# Patient Record
Sex: Female | Born: 1975
Health system: Southern US, Community
[De-identification: ages and names within clinical notes are randomized; demographics above are authoritative.]

## PROBLEM LIST (undated history)

## (undated) DIAGNOSIS — G43909 Migraine, unspecified, not intractable, without status migrainosus: Secondary | ICD-10-CM

## (undated) DIAGNOSIS — I509 Heart failure, unspecified: Secondary | ICD-10-CM

## (undated) DIAGNOSIS — F419 Anxiety disorder, unspecified: Secondary | ICD-10-CM

## (undated) DIAGNOSIS — J42 Unspecified chronic bronchitis: Secondary | ICD-10-CM

## (undated) DIAGNOSIS — F319 Bipolar disorder, unspecified: Secondary | ICD-10-CM

## (undated) DIAGNOSIS — E7800 Pure hypercholesterolemia, unspecified: Secondary | ICD-10-CM

## (undated) DIAGNOSIS — F329 Major depressive disorder, single episode, unspecified: Secondary | ICD-10-CM

## (undated) DIAGNOSIS — R51 Headache: Secondary | ICD-10-CM

## (undated) DIAGNOSIS — F32A Depression, unspecified: Secondary | ICD-10-CM

## (undated) DIAGNOSIS — R519 Headache, unspecified: Secondary | ICD-10-CM

## (undated) DIAGNOSIS — G4730 Sleep apnea, unspecified: Secondary | ICD-10-CM

## (undated) DIAGNOSIS — I10 Essential (primary) hypertension: Secondary | ICD-10-CM

## (undated) DIAGNOSIS — E119 Type 2 diabetes mellitus without complications: Secondary | ICD-10-CM

## (undated) DIAGNOSIS — F209 Schizophrenia, unspecified: Secondary | ICD-10-CM

## (undated) HISTORY — PX: TOTAL KNEE ARTHROPLASTY: SHX125

## (undated) HISTORY — PX: PERCUTANEOUS PINNING PHALANX FRACTURE OF HAND: SUR1027

## (undated) HISTORY — PX: TONSILLECTOMY: SUR1361

## (undated) HISTORY — PX: JOINT REPLACEMENT: SHX530

## (undated) HISTORY — PX: LAPAROSCOPIC CHOLECYSTECTOMY: SUR755

---
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
## 2007-07-25 ENCOUNTER — Ambulatory Visit: Payer: Self-pay | Admitting: Psychiatry

## 2007-07-25 ENCOUNTER — Inpatient Hospital Stay (HOSPITAL_COMMUNITY): Admission: AD | Admit: 2007-07-25 | Discharge: 2007-07-26 | Payer: Self-pay | Admitting: Psychiatry

## 2010-07-23 NOTE — Discharge Summary (Signed)
NAME:  Diana Rivera, Diana Rivera NO.:  [id number]   MEDICAL RECORD NO.:  [id number]          PATIENT TYPE:  IPS   LOCATION:  0301                          FACILITY:  [facility]   PHYSICIAN:  Jasmine Pang, M.D. DATE OF BIRTH:  May 02, 1975   DATE OF ADMISSION:  07/25/2007  DATE OF DISCHARGE:  07/26/2007                               DISCHARGE SUMMARY   IDENTIFICATION:  This is a 35 year old single white female from  [place], [place], who was admitted on an involuntary basis on  Jul 25, 2007.   HISTORY OF PRESENT ILLNESS:  The patient is here on commitment papers.  The papers state that she was threatening an overdose on medications,  saying she wanted to die and was going to kill herself.  The patient  reports that she was very upset because her dog died yesterday.  She was  admitted by her aunt because she began to drink some and began to make  statements about wanting to die.  Her aunt felt she was dangerous and  took out IHM papers on her.  She denies a history of depression or mood  swings.  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   PAST PSYCHIATRIC HISTORY:  This is the first [facility] admission.  There was  no previous history of psychiatric problems.   ALCOHOL AND DRUG HISTORY:  As above, she denies drug use.  She drinks  wine daily according to the commitment papers.   MEDICAL HISTORY:  The patient has a history of GERD.  She has had a  recent esophageal study.   MEDICATIONS:  Protonix 40 mg daily.   ALLERGIES:  No known drug allergies.   PHYSICAL FINDINGS:  There were no acute physical or medical problems  noted.   ADMISSION LABORATORIES:  These were done in the ED prior to admission  and evaluated by the ED physician.   HOSPITAL COURSE:  Upon admission, the patient was started on Protonix 40  mg daily. She was also started on Librium detox protocol, although it  was felt this was not necessary given her reports of not  drinking as  much as the family stated.  She was friendly and cooperative.  In  individual sessions, she also participated appropriately in unit  therapeutic groups and activities.  She denied she was suicidal.  Her  general appearance was neat, eye contact good, motor behavior normal,  speech normal rate and flow.  Mood euthymic.  Affect appropriate.  Anxiety level none.  Thought process is coherent.  Thought content, no  predominant theme.  Cognitive was grossly back to baseline.  Perception  revealed no abnormalities.  Judgment was good and insight was fair.  It  was felt, the patient was safe for discharge today and was sent home.  After a case manager talked with her mother, her mother felt completely  safe with the patient being discharged and did not feel that she was a  threat to herself or others.   DISCHARGE DIAGNOSES:  Axis I:  Depressive disorder not otherwise  specified.  Polysubstance abuse ( there was cocaine and benzodiazepines  in her urine drug screen, though she felt she got benzodiazepines at the  hospital).  Axis II:  None.  Axis III:  Gastroesophageal reflux disease.  Axis IV:  Severe (recent death of dog, problems with primary support  group, other psychosocial problems, burden of psychiatric and substance  abuse illness).  Axis V:  Global assessment of functioning was 50 upon discharge.  GAF  was 45 upon admission.  GAF highest past year was 60-65.   DISCHARGE PLANS:  There was no specific activity level or dietary  restrictions.   POSTHOSPITAL CARE PLANS:  The patient will go to [facility] on Jul 27, 2007, at 9:00 a.m.   DISCHARGE MEDICATIONS:  None.   She is to follow up with her family doctor in 2 weeks for repeat labs.      Jasmine Pang, M.D.  Electronically Signed     BHS/MEDQ  D:  07/26/2007  T:  07/27/2007  Job:  [id number]

## 2010-12-04 LAB — URINALYSIS, ROUTINE W REFLEX MICROSCOPIC
Leukocytes, UA: NEGATIVE
Nitrite: NEGATIVE
Protein, ur: NEGATIVE
Urobilinogen, UA: 0.2

## 2010-12-04 LAB — URINE MICROSCOPIC-ADD ON

## 2010-12-04 LAB — CBC
Hemoglobin: 12.8
MCHC: 35.5
RDW: 13

## 2010-12-04 LAB — TSH: TSH: 2.897

## 2018-02-15 ENCOUNTER — Emergency Department (HOSPITAL_COMMUNITY): Payer: Self-pay

## 2018-02-15 ENCOUNTER — Inpatient Hospital Stay (HOSPITAL_COMMUNITY)
Admission: EM | Admit: 2018-02-15 | Discharge: 2018-03-05 | DRG: 086 | Disposition: A | Discharge status: Home / Self Care | Payer: Self-pay | Attending: General Surgery | Admitting: General Surgery

## 2018-02-15 ENCOUNTER — Encounter (HOSPITAL_COMMUNITY): Payer: Self-pay | Admitting: *Deleted

## 2018-02-15 ENCOUNTER — Other Ambulatory Visit: Payer: Self-pay

## 2018-02-15 DIAGNOSIS — S0280XA Fracture of other specified skull and facial bones, unspecified side, initial encounter for closed fracture: Secondary | ICD-10-CM

## 2018-02-15 DIAGNOSIS — S0285XA Fracture of orbit, unspecified, initial encounter for closed fracture: Principal | ICD-10-CM | POA: Diagnosis present

## 2018-02-15 DIAGNOSIS — R200 Anesthesia of skin: Secondary | ICD-10-CM | POA: Diagnosis present

## 2018-02-15 DIAGNOSIS — E876 Hypokalemia: Secondary | ICD-10-CM | POA: Diagnosis present

## 2018-02-15 DIAGNOSIS — S02402A Zygomatic fracture, unspecified, initial encounter for closed fracture: Secondary | ICD-10-CM

## 2018-02-15 DIAGNOSIS — S01112A Laceration without foreign body of left eyelid and periocular area, initial encounter: Secondary | ICD-10-CM | POA: Diagnosis present

## 2018-02-15 DIAGNOSIS — D696 Thrombocytopenia, unspecified: Secondary | ICD-10-CM | POA: Diagnosis present

## 2018-02-15 DIAGNOSIS — S80212A Abrasion, left knee, initial encounter: Secondary | ICD-10-CM | POA: Diagnosis present

## 2018-02-15 DIAGNOSIS — S0083XA Contusion of other part of head, initial encounter: Secondary | ICD-10-CM

## 2018-02-15 DIAGNOSIS — Z9049 Acquired absence of other specified parts of digestive tract: Secondary | ICD-10-CM

## 2018-02-15 DIAGNOSIS — S30810A Abrasion of lower back and pelvis, initial encounter: Secondary | ICD-10-CM | POA: Diagnosis present

## 2018-02-15 DIAGNOSIS — K920 Hematemesis: Secondary | ICD-10-CM | POA: Diagnosis present

## 2018-02-15 DIAGNOSIS — I509 Heart failure, unspecified: Secondary | ICD-10-CM | POA: Diagnosis present

## 2018-02-15 DIAGNOSIS — S02401A Maxillary fracture, unspecified, initial encounter for closed fracture: Secondary | ICD-10-CM

## 2018-02-15 DIAGNOSIS — K7460 Unspecified cirrhosis of liver: Secondary | ICD-10-CM | POA: Diagnosis present

## 2018-02-15 DIAGNOSIS — R748 Abnormal levels of other serum enzymes: Secondary | ICD-10-CM

## 2018-02-15 DIAGNOSIS — E119 Type 2 diabetes mellitus without complications: Secondary | ICD-10-CM | POA: Diagnosis present

## 2018-02-15 DIAGNOSIS — H1131 Conjunctival hemorrhage, right eye: Secondary | ICD-10-CM | POA: Diagnosis present

## 2018-02-15 DIAGNOSIS — S0990XA Unspecified injury of head, initial encounter: Secondary | ICD-10-CM

## 2018-02-15 DIAGNOSIS — S02609A Fracture of mandible, unspecified, initial encounter for closed fracture: Secondary | ICD-10-CM | POA: Diagnosis present

## 2018-02-15 DIAGNOSIS — Y908 Blood alcohol level of 240 mg/100 ml or more: Secondary | ICD-10-CM | POA: Diagnosis present

## 2018-02-15 DIAGNOSIS — I110 Hypertensive heart disease with heart failure: Secondary | ICD-10-CM | POA: Diagnosis present

## 2018-02-15 DIAGNOSIS — Z886 Allergy status to analgesic agent status: Secondary | ICD-10-CM

## 2018-02-15 DIAGNOSIS — F1010 Alcohol abuse, uncomplicated: Secondary | ICD-10-CM

## 2018-02-15 DIAGNOSIS — S022XXA Fracture of nasal bones, initial encounter for closed fracture: Secondary | ICD-10-CM | POA: Diagnosis present

## 2018-02-15 DIAGNOSIS — K7290 Hepatic failure, unspecified without coma: Secondary | ICD-10-CM | POA: Diagnosis not present

## 2018-02-15 DIAGNOSIS — Z590 Homelessness: Secondary | ICD-10-CM

## 2018-02-15 DIAGNOSIS — F10129 Alcohol abuse with intoxication, unspecified: Secondary | ICD-10-CM | POA: Diagnosis present

## 2018-02-15 DIAGNOSIS — S80211A Abrasion, right knee, initial encounter: Secondary | ICD-10-CM | POA: Diagnosis present

## 2018-02-15 DIAGNOSIS — S0230XA Fracture of orbital floor, unspecified side, initial encounter for closed fracture: Secondary | ICD-10-CM

## 2018-02-15 DIAGNOSIS — Z23 Encounter for immunization: Secondary | ICD-10-CM

## 2018-02-15 HISTORY — DX: Type 2 diabetes mellitus without complications: E11.9

## 2018-02-15 HISTORY — DX: Headache: R51

## 2018-02-15 HISTORY — DX: Anxiety disorder, unspecified: F41.9

## 2018-02-15 HISTORY — DX: Schizophrenia, unspecified: F20.9

## 2018-02-15 HISTORY — DX: Headache, unspecified: R51.9

## 2018-02-15 HISTORY — DX: Unspecified chronic bronchitis: J42

## 2018-02-15 HISTORY — DX: Major depressive disorder, single episode, unspecified: F32.9

## 2018-02-15 HISTORY — DX: Essential (primary) hypertension: I10

## 2018-02-15 HISTORY — DX: Depression, unspecified: F32.A

## 2018-02-15 HISTORY — DX: Sleep apnea, unspecified: G47.30

## 2018-02-15 HISTORY — DX: Heart failure, unspecified: I50.9

## 2018-02-15 HISTORY — DX: Migraine, unspecified, not intractable, without status migrainosus: G43.909

## 2018-02-15 HISTORY — DX: Bipolar disorder, unspecified: F31.9

## 2018-02-15 HISTORY — DX: Pure hypercholesterolemia, unspecified: E78.00

## 2018-02-15 LAB — I-STAT CG4 LACTIC ACID, ED: Lactic Acid, Venous: 3.02 mmol/L (ref 0.5–1.9)

## 2018-02-15 LAB — I-STAT CHEM 8, ED
BUN: 4 mg/dL — ABNORMAL LOW (ref 6–20)
CREATININE: 1 mg/dL (ref 0.44–1.00)
Calcium, Ion: 0.97 mmol/L — ABNORMAL LOW (ref 1.15–1.40)
Chloride: 103 mmol/L (ref 98–111)
Glucose, Bld: 108 mg/dL — ABNORMAL HIGH (ref 70–99)
HEMATOCRIT: 39 % (ref 36.0–46.0)
HEMOGLOBIN: 13.3 g/dL (ref 12.0–15.0)
POTASSIUM: 3.2 mmol/L — AB (ref 3.5–5.1)
Sodium: 144 mmol/L (ref 135–145)
TCO2: 29 mmol/L (ref 22–32)

## 2018-02-15 LAB — CBC
HCT: 41.2 % (ref 36.0–46.0)
Hemoglobin: 13.5 g/dL (ref 12.0–15.0)
MCH: 30.1 pg (ref 26.0–34.0)
MCHC: 32.8 g/dL (ref 30.0–36.0)
MCV: 91.8 fL (ref 80.0–100.0)
NRBC: 0 % (ref 0.0–0.2)
PLATELETS: 47 10*3/uL — AB (ref 150–400)
RBC: 4.49 MIL/uL (ref 3.87–5.11)
RDW: 13.7 % (ref 11.5–15.5)
WBC: 2.6 10*3/uL — AB (ref 4.0–10.5)

## 2018-02-15 LAB — URINALYSIS, ROUTINE W REFLEX MICROSCOPIC
BILIRUBIN URINE: NEGATIVE
Glucose, UA: NEGATIVE mg/dL
Ketones, ur: NEGATIVE mg/dL
Nitrite: NEGATIVE
PROTEIN: NEGATIVE mg/dL
SPECIFIC GRAVITY, URINE: 1.028 (ref 1.005–1.030)
pH: 6 (ref 5.0–8.0)

## 2018-02-15 LAB — COMPREHENSIVE METABOLIC PANEL
ALT: 114 U/L — AB (ref 0–44)
AST: 275 U/L — AB (ref 15–41)
Albumin: 3.8 g/dL (ref 3.5–5.0)
Alkaline Phosphatase: 99 U/L (ref 38–126)
Anion gap: 16 — ABNORMAL HIGH (ref 5–15)
CHLORIDE: 103 mmol/L (ref 98–111)
CO2: 25 mmol/L (ref 22–32)
CREATININE: 0.53 mg/dL (ref 0.44–1.00)
Calcium: 8.4 mg/dL — ABNORMAL LOW (ref 8.9–10.3)
Glucose, Bld: 105 mg/dL — ABNORMAL HIGH (ref 70–99)
POTASSIUM: 3.2 mmol/L — AB (ref 3.5–5.1)
SODIUM: 144 mmol/L (ref 135–145)
Total Bilirubin: 2 mg/dL — ABNORMAL HIGH (ref 0.3–1.2)
Total Protein: 7.3 g/dL (ref 6.5–8.1)

## 2018-02-15 LAB — PROTIME-INR
INR: 1.36
Prothrombin Time: 16.6 seconds — ABNORMAL HIGH (ref 11.4–15.2)

## 2018-02-15 LAB — I-STAT BETA HCG BLOOD, ED (MC, WL, AP ONLY): I-stat hCG, quantitative: <5 m[IU]/mL (ref <5)

## 2018-02-15 LAB — SAMPLE TO BLOOD BANK

## 2018-02-15 LAB — ETHANOL: ALCOHOL ETHYL (B): 356 mg/dL — AB (ref <10)

## 2018-02-15 LAB — CDS SEROLOGY

## 2018-02-15 MED ORDER — SODIUM CHLORIDE 0.9 % IV SOLN
250.0000 mg | Freq: Once | INTRAVENOUS | Status: AC
  Administered 2018-02-15: 250 mg via INTRAVENOUS
  Filled 2018-02-15: qty 2

## 2018-02-15 MED ORDER — SODIUM CHLORIDE 0.9 % IV BOLUS
500.0000 mL | Freq: Once | INTRAVENOUS | Status: AC
  Administered 2018-02-15: 500 mL via INTRAVENOUS

## 2018-02-15 MED ORDER — LIDOCAINE-EPINEPHRINE (PF) 2 %-1:200000 IJ SOLN
10.0000 mL | Freq: Once | INTRAMUSCULAR | Status: AC
  Administered 2018-02-15: 10 mL

## 2018-02-15 MED ORDER — CEFAZOLIN SODIUM-DEXTROSE 1-4 GM/50ML-% IV SOLN
1.0000 g | Freq: Once | INTRAVENOUS | Status: AC
  Administered 2018-02-15: 1 g via INTRAVENOUS
  Filled 2018-02-15: qty 50

## 2018-02-15 MED ORDER — OXYCODONE HCL 5 MG PO TABS
5.0000 mg | ORAL_TABLET | ORAL | Status: DC | PRN
  Administered 2018-02-15 (×2): 5 mg via ORAL
  Administered 2018-02-15: 10 mg via ORAL
  Administered 2018-02-15 – 2018-02-16 (×3): 5 mg via ORAL
  Administered 2018-02-16 (×2): 10 mg via ORAL
  Administered 2018-02-16: 5 mg via ORAL
  Administered 2018-02-16 – 2018-02-18 (×11): 10 mg via ORAL
  Administered 2018-02-19: 5 mg via ORAL
  Administered 2018-02-19: 10 mg via ORAL
  Administered 2018-02-19: 5 mg via ORAL
  Administered 2018-02-19 – 2018-02-22 (×13): 10 mg via ORAL
  Administered 2018-02-22: 5 mg via ORAL
  Filled 2018-02-15 (×8): qty 2
  Filled 2018-02-15: qty 1
  Filled 2018-02-15 (×5): qty 2
  Filled 2018-02-15: qty 1
  Filled 2018-02-15: qty 2
  Filled 2018-02-15: qty 1
  Filled 2018-02-15 (×4): qty 2
  Filled 2018-02-15: qty 1
  Filled 2018-02-15 (×4): qty 2
  Filled 2018-02-15: qty 1
  Filled 2018-02-15 (×3): qty 2
  Filled 2018-02-15: qty 1
  Filled 2018-02-15: qty 2
  Filled 2018-02-15: qty 1
  Filled 2018-02-15 (×6): qty 2

## 2018-02-15 MED ORDER — SODIUM CHLORIDE 0.9 % IV BOLUS
1000.0000 mL | Freq: Once | INTRAVENOUS | Status: AC
  Administered 2018-02-15: 1000 mL via INTRAVENOUS

## 2018-02-15 MED ORDER — ADULT MULTIVITAMIN W/MINERALS CH
1.0000 | ORAL_TABLET | Freq: Every day | ORAL | Status: DC
  Administered 2018-02-15 – 2018-03-05 (×19): 1 via ORAL
  Filled 2018-02-15 (×19): qty 1

## 2018-02-15 MED ORDER — VITAMIN B-1 100 MG PO TABS
100.0000 mg | ORAL_TABLET | Freq: Every day | ORAL | Status: DC
  Administered 2018-02-15 – 2018-03-05 (×19): 100 mg via ORAL
  Filled 2018-02-15 (×20): qty 1

## 2018-02-15 MED ORDER — LACTULOSE 10 GM/15ML PO SOLN
10.0000 g | Freq: Two times a day (BID) | ORAL | Status: DC
  Administered 2018-02-15 (×2): 10 g via ORAL
  Filled 2018-02-15 (×4): qty 15

## 2018-02-15 MED ORDER — METHYLPREDNISOLONE SODIUM SUCC 125 MG IJ SOLR
125.0000 mg | Freq: Four times a day (QID) | INTRAMUSCULAR | Status: DC
  Administered 2018-02-15 (×2): 125 mg via INTRAVENOUS
  Filled 2018-02-15 (×2): qty 2

## 2018-02-15 MED ORDER — FUROSEMIDE 10 MG/ML IJ SOLN
20.0000 mg | Freq: Every day | INTRAMUSCULAR | Status: DC
  Administered 2018-02-16 – 2018-02-19 (×4): 20 mg via INTRAVENOUS
  Filled 2018-02-15 (×5): qty 2

## 2018-02-15 MED ORDER — KCL IN DEXTROSE-NACL 20-5-0.45 MEQ/L-%-% IV SOLN
INTRAVENOUS | Status: DC
  Administered 2018-02-15: 75 mL/h via INTRAVENOUS
  Administered 2018-02-16 – 2018-02-19 (×7): via INTRAVENOUS
  Filled 2018-02-15 (×9): qty 1000

## 2018-02-15 MED ORDER — HYDROMORPHONE HCL 1 MG/ML IJ SOLN
1.0000 mg | INTRAMUSCULAR | Status: DC | PRN
  Administered 2018-02-15 – 2018-02-17 (×12): 1 mg via INTRAVENOUS
  Filled 2018-02-15 (×13): qty 1

## 2018-02-15 MED ORDER — PANTOPRAZOLE SODIUM 40 MG PO TBEC
40.0000 mg | DELAYED_RELEASE_TABLET | Freq: Every day | ORAL | Status: DC
  Administered 2018-02-15 – 2018-02-19 (×5): 40 mg via ORAL
  Filled 2018-02-15 (×7): qty 1

## 2018-02-15 MED ORDER — FENTANYL CITRATE (PF) 100 MCG/2ML IJ SOLN
50.0000 ug | Freq: Once | INTRAMUSCULAR | Status: AC
  Administered 2018-02-15: 50 ug via INTRAVENOUS
  Filled 2018-02-15: qty 2

## 2018-02-15 MED ORDER — LORAZEPAM 2 MG/ML IJ SOLN
1.0000 mg | INTRAMUSCULAR | Status: DC | PRN
  Administered 2018-02-15: 1 mg via INTRAVENOUS
  Administered 2018-02-15 – 2018-02-17 (×4): 2 mg via INTRAVENOUS
  Filled 2018-02-15 (×8): qty 1

## 2018-02-15 MED ORDER — POTASSIUM CHLORIDE CRYS ER 20 MEQ PO TBCR
40.0000 meq | EXTENDED_RELEASE_TABLET | Freq: Once | ORAL | Status: AC
  Administered 2018-02-15: 40 meq via ORAL
  Filled 2018-02-15: qty 2

## 2018-02-15 MED ORDER — IOHEXOL 300 MG/ML  SOLN
100.0000 mL | Freq: Once | INTRAMUSCULAR | Status: AC | PRN
  Administered 2018-02-15: 100 mL via INTRAVENOUS

## 2018-02-15 MED ORDER — PANTOPRAZOLE SODIUM 40 MG IV SOLR: 40.0000 mg | Freq: Every day | INTRAVENOUS | Status: DC

## 2018-02-15 MED ORDER — DOCUSATE SODIUM 100 MG PO CAPS
100.0000 mg | ORAL_CAPSULE | Freq: Two times a day (BID) | ORAL | Status: DC
  Administered 2018-02-15 – 2018-03-05 (×37): 100 mg via ORAL
  Filled 2018-02-15 (×37): qty 1

## 2018-02-15 MED ORDER — LORAZEPAM 2 MG/ML IJ SOLN: 2.0000 mg | INTRAMUSCULAR | Status: DC | PRN

## 2018-02-15 MED ORDER — TETANUS-DIPHTH-ACELL PERTUSSIS 5-2.5-18.5 LF-MCG/0.5 IM SUSP
0.5000 mL | Freq: Once | INTRAMUSCULAR | Status: AC
  Administered 2018-02-15: 0.5 mL via INTRAMUSCULAR
  Filled 2018-02-15: qty 0.5

## 2018-02-15 MED ORDER — ONDANSETRON 4 MG PO TBDP
4.0000 mg | ORAL_TABLET | Freq: Four times a day (QID) | ORAL | Status: DC | PRN
  Administered 2018-02-21 – 2018-02-27 (×3): 4 mg via ORAL
  Filled 2018-02-15 (×3): qty 1

## 2018-02-15 MED ORDER — ACETAMINOPHEN 325 MG PO TABS: 650.0000 mg | ORAL_TABLET | Freq: Once | ORAL | Status: DC

## 2018-02-15 MED ORDER — ONDANSETRON HCL 4 MG/2ML IJ SOLN
4.0000 mg | Freq: Four times a day (QID) | INTRAMUSCULAR | Status: DC | PRN
  Administered 2018-02-16 – 2018-02-17 (×2): 4 mg via INTRAVENOUS
  Filled 2018-02-15 (×2): qty 2

## 2018-02-15 MED ORDER — ONDANSETRON HCL 4 MG/2ML IJ SOLN
4.0000 mg | Freq: Once | INTRAMUSCULAR | Status: AC
  Administered 2018-02-15: 4 mg via INTRAVENOUS
  Filled 2018-02-15: qty 2

## 2018-02-15 MED ORDER — LIDOCAINE-EPINEPHRINE (PF) 2 %-1:200000 IJ SOLN
INTRAMUSCULAR | Status: AC
  Administered 2018-02-15: 10 mL
  Filled 2018-02-15: qty 10

## 2018-02-15 MED ORDER — POTASSIUM CHLORIDE 10 MEQ/100ML IV SOLN
10.0000 meq | INTRAVENOUS | Status: AC
  Administered 2018-02-15: 10 meq via INTRAVENOUS
  Filled 2018-02-15: qty 100

## 2018-02-15 MED ORDER — HYDRALAZINE HCL 20 MG/ML IJ SOLN: 10.0000 mg | INTRAMUSCULAR | Status: DC | PRN
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
## 2018-02-15 NOTE — ED Notes (Signed)
Pt ambulated to RR .  

## 2018-02-15 NOTE — ED Notes (Signed)
Patient's brother Onalee Hua(David) can be reached at [phone]

## 2018-02-15 NOTE — Consult Note (Signed)
S: No improvement in vision left eye and still swollen. Got IV steroids  O:  VA: OD 20/400 equivalent, OS unable to open eye sufficiently to test - denies seeing anything  L/L: OD: mild ecchymosis upper eyelid, no significant swelling OS: swollen shut, ecchymosis  C/S: OD: W/Q OS: subconj heme w/ chemosis - unclear if occult rupture underneath  K: OD: clear OS: clear  A/C: OD: deep and quiet, formed OS: deep and quiet - no hyphema  I: OD: round and regular OS: round and regular - no peaking  L: OD: clear OS: clear  DFE: dilation at 5:45 PM  V:  OD: clear OS: clear - no vit heme  Nerve: OD: C/d 0.1 - normal OS: unable to view  Macula: OD: healthy OS: unable to view due to swelling  Vessels: OD: normal OS: normal in areas visualized  Periphery: OD: flat 360 OS: flat in areas seen but unable to visualize due to edema  IOP: 24 OD, OS unable  A/P: 1. Orbital Fracture OS: - Management per ENT - still unable to assess to definitively rule out ruptured globe  2. Orbital Edema OS: - Unable to get sufficient exam due to edema but some reassuring signs OS that hopefully no ruptured globe - cornea without rupture, pupil was round w/o peaking, anterior chamber and vitreous w/o hemorrhage - Discussed that while not able to get great exam due to edema that signs are reassuring enough that can hold off on going to the OR for exploration today, will wait until repeat exam tomorrow - Repeat assessment in 24 hours (Tuesday after 5 PM) - Can stop IV steroids at this time - Keep eye shield OS  - Can eat per ENT soft diet recommendations tonight and tomorrow AM, remain NPO after 10 AM tomorrow for possible surgery tomorrow evening   Estiben Mizuno T. Sherryll BurgerShah, MD
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
## 2018-02-15 NOTE — ED Notes (Signed)
Contacted patient's brother and he states that he will call hospital after he gets off work and may come and see her.

## 2018-02-15 NOTE — Consult Note (Signed)
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 repair but will require a soft diet for 6 weeks.  It is possible she may need reduction of the nasal fracture and possibly open reduction internal fixation of the left tripod fracture.  I will reevaluate in 48 hours.  That can be while in the hospital or as an outpatient as the clinical situation dictates.  Serena Colonel 02/15/2018, 8:27 AM

## 2018-02-15 NOTE — ED Notes (Signed)
Attending called back with verbal to admin 1 mg for CIWA /t RN concern re: meds previously given

## 2018-02-15 NOTE — ED Notes (Signed)
Pt with PT at this time ambulating in the hall
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
## 2018-02-16 LAB — CBC
HCT: 37.2 % (ref 36.0–46.0)
HCT: 36.6 % (ref 36.0–46.0)
HEMOGLOBIN: 12.5 g/dL (ref 12.0–15.0)
Hemoglobin: 12.6 g/dL (ref 12.0–15.0)
MCH: 30.3 pg (ref 26.0–34.0)
MCH: 30.5 pg (ref 26.0–34.0)
MCHC: 33.9 g/dL (ref 30.0–36.0)
MCHC: 34.2 g/dL (ref 30.0–36.0)
MCV: 89.4 fL (ref 80.0–100.0)
MCV: 89.3 fL (ref 80.0–100.0)
PLATELETS: 36 10*3/uL — AB (ref 150–400)
Platelets: 34 10*3/uL — ABNORMAL LOW (ref 150–400)
RBC: 4.16 MIL/uL (ref 3.87–5.11)
RBC: 4.1 MIL/uL (ref 3.87–5.11)
RDW: 13.2 % (ref 11.5–15.5)
RDW: 13.1 % (ref 11.5–15.5)
WBC: 4.1 10*3/uL (ref 4.0–10.5)
WBC: 4.4 10*3/uL (ref 4.0–10.5)
nRBC: 0 % (ref 0.0–0.2)
nRBC: 0 % (ref 0.0–0.2)

## 2018-02-16 LAB — HIV ANTIBODY (ROUTINE TESTING W REFLEX): HIV Screen 4th Generation wRfx: NONREACTIVE

## 2018-02-16 LAB — LACTIC ACID, PLASMA
LACTIC ACID, VENOUS: 1.9 mmol/L (ref 0.5–1.9)
LACTIC ACID, VENOUS: 2.6 mmol/L — AB (ref 0.5–1.9)
Lactic Acid, Venous: 2 mmol/L (ref 0.5–1.9)

## 2018-02-16 LAB — COMPREHENSIVE METABOLIC PANEL
ALT: 99 U/L — ABNORMAL HIGH (ref 0–44)
AST: 185 U/L — ABNORMAL HIGH (ref 15–41)
Albumin: 3.5 g/dL (ref 3.5–5.0)
Alkaline Phosphatase: 98 U/L (ref 38–126)
Anion gap: 11 (ref 5–15)
BUN: <5 mg/dL — ABNORMAL LOW (ref 6–20)
CHLORIDE: 98 mmol/L (ref 98–111)
CO2: 26 mmol/L (ref 22–32)
Calcium: 8.9 mg/dL (ref 8.9–10.3)
Creatinine, Ser: 0.54 mg/dL (ref 0.44–1.00)
GFR calc Af Amer: >60 mL/min (ref >60)
GFR calc non Af Amer: >60 mL/min (ref >60)
Glucose, Bld: 198 mg/dL — ABNORMAL HIGH (ref 70–99)
Potassium: 5 mmol/L (ref 3.5–5.1)
Sodium: 135 mmol/L (ref 135–145)
Total Bilirubin: 1.9 mg/dL — ABNORMAL HIGH (ref 0.3–1.2)
Total Protein: 6.9 g/dL (ref 6.5–8.1)

## 2018-02-16 LAB — ABO/RH: ABO/RH(D): A POS

## 2018-02-16 MED ORDER — LACTULOSE 10 GM/15ML PO SOLN
20.0000 g | Freq: Every day | ORAL | Status: DC
  Administered 2018-02-16 – 2018-02-17 (×2): 20 g via ORAL
  Filled 2018-02-16 (×2): qty 30

## 2018-02-16 MED ORDER — FOLIC ACID 1 MG PO TABS
1.0000 mg | ORAL_TABLET | Freq: Every day | ORAL | Status: DC
  Administered 2018-02-16 – 2018-03-05 (×18): 1 mg via ORAL
  Filled 2018-02-16 (×18): qty 1

## 2018-02-16 MED ORDER — SPIRONOLACTONE 25 MG PO TABS: 25.0000 mg | ORAL_TABLET | Freq: Every day | ORAL | Status: DC

## 2018-02-16 MED ORDER — BUSPIRONE HCL 5 MG PO TABS
15.0000 mg | ORAL_TABLET | Freq: Three times a day (TID) | ORAL | Status: DC
  Administered 2018-02-16 – 2018-03-05 (×52): 15 mg via ORAL
  Filled 2018-02-16 (×52): qty 3

## 2018-02-16 MED ORDER — SODIUM CHLORIDE 0.9% IV SOLUTION: Freq: Once | INTRAVENOUS | Status: DC

## 2018-02-16 MED ORDER — FUROSEMIDE 20 MG PO TABS: 20.0000 mg | ORAL_TABLET | Freq: Every day | ORAL | Status: DC

## 2018-02-16 MED ORDER — SODIUM CHLORIDE 0.9 % IV BOLUS
1000.0000 mL | Freq: Once | INTRAVENOUS | Status: AC
  Administered 2018-02-16: 1000 mL via INTRAVENOUS
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
## 2018-02-16 NOTE — Progress Notes (Signed)
CRITICAL VALUE ALERT  Critical Value:  Lactic Acid:  2.6  Date & Time Notied:  02/16/18 at 12:06  Provider Notified: Hosie SpangleElizabeth Simaan, PA-C  Orders Received/Actions taken: None at this time

## 2018-02-17 ENCOUNTER — Other Ambulatory Visit: Payer: Self-pay

## 2018-02-17 ENCOUNTER — Encounter (HOSPITAL_COMMUNITY): Payer: Self-pay | Admitting: General Practice

## 2018-02-17 LAB — AMMONIA: Ammonia: 90 umol/L — ABNORMAL HIGH (ref 9–35)

## 2018-02-17 LAB — PREPARE PLATELET PHERESIS: Unit division: 0

## 2018-02-17 LAB — VITAMIN B12: Vitamin B-12: 1225 pg/mL — ABNORMAL HIGH (ref 180–914)

## 2018-02-17 LAB — CBC
HCT: 38.9 % (ref 36.0–46.0)
Hemoglobin: 12.9 g/dL (ref 12.0–15.0)
MCH: 30.1 pg (ref 26.0–34.0)
MCHC: 33.2 g/dL (ref 30.0–36.0)
MCV: 90.7 fL (ref 80.0–100.0)
NRBC: 0 % (ref 0.0–0.2)
Platelets: 47 10*3/uL — ABNORMAL LOW (ref 150–400)
RBC: 4.29 MIL/uL (ref 3.87–5.11)
RDW: 13.4 % (ref 11.5–15.5)
WBC: 6.5 10*3/uL (ref 4.0–10.5)

## 2018-02-17 LAB — BPAM PLATELET PHERESIS
Blood Product Expiration Date: 201912102359
Unit Type and Rh: 6200

## 2018-02-17 LAB — LACTIC ACID, PLASMA: Lactic Acid, Venous: 1.5 mmol/L (ref 0.5–1.9)

## 2018-02-17 MED ORDER — LACTULOSE 10 GM/15ML PO SOLN: 30.0000 g | Freq: Every day | ORAL | Status: DC

## 2018-02-17 MED ORDER — INFLUENZA VAC SPLIT QUAD 0.5 ML IM SUSY
0.5000 mL | PREFILLED_SYRINGE | INTRAMUSCULAR | Status: AC
  Administered 2018-02-18: 0.5 mL via INTRAMUSCULAR
  Filled 2018-02-17: qty 0.5

## 2018-02-17 MED ORDER — LACTULOSE 10 GM/15ML PO SOLN
10.0000 g | Freq: Once | ORAL | Status: AC
  Administered 2018-02-17: 10 g via ORAL
  Filled 2018-02-17: qty 15

## 2018-02-17 MED ORDER — ENSURE ENLIVE PO LIQD
237.0000 mL | Freq: Two times a day (BID) | ORAL | Status: DC
  Administered 2018-02-17: 237 mL via ORAL

## 2018-02-17 NOTE — Progress Notes (Signed)
New confusion. Pain in inner mouth. Low O2 levels while sleeping and high MAP tonight. Maeola SarahGina Zain Lankford, RN

## 2018-02-17 NOTE — Progress Notes (Signed)
Patient ID: Diana BrownerJonie Rivera, female   DOB: 11-Oct-1975, 42 y.o.   MRN: [id number]  Repeat evaluation today.  The nasal bones appear to be stable and symmetric.  There is no obvious displaced fracture.  There is no palpable step-off of the orbital rim on the left.  The zygomatic arches are stable and appear to be symmetric.  No evidence of displaced fracture.  No need for surgical intervention.  Again, plan 6 weeks on soft diet for the left subcondylar mandible fracture.  Follow-up with me as an outpatient as needed.
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
## 2018-02-18 LAB — CBC
HCT: 40.5 % (ref 36.0–46.0)
Hemoglobin: 13.2 g/dL (ref 12.0–15.0)
MCH: 30 pg (ref 26.0–34.0)
MCHC: 32.6 g/dL (ref 30.0–36.0)
MCV: 92 fL (ref 80.0–100.0)
Platelets: 42 10*3/uL — ABNORMAL LOW (ref 150–400)
RBC: 4.4 MIL/uL (ref 3.87–5.11)
RDW: 13.4 % (ref 11.5–15.5)
WBC: 3.4 10*3/uL — ABNORMAL LOW (ref 4.0–10.5)
nRBC: 0 % (ref 0.0–0.2)

## 2018-02-18 LAB — FOLATE RBC
Folate, Hemolysate: 414.8 ng/mL
Folate, RBC: 1077 ng/mL (ref >498)
HEMATOCRIT: 38.5 % (ref 34.0–46.6)

## 2018-02-18 LAB — COMPREHENSIVE METABOLIC PANEL
ALBUMIN: 3.5 g/dL (ref 3.5–5.0)
ALT: 145 U/L — ABNORMAL HIGH (ref 0–44)
AST: 195 U/L — ABNORMAL HIGH (ref 15–41)
Alkaline Phosphatase: 102 U/L (ref 38–126)
Anion gap: 10 (ref 5–15)
BILIRUBIN TOTAL: 2.6 mg/dL — AB (ref 0.3–1.2)
BUN: <5 mg/dL — ABNORMAL LOW (ref 6–20)
CALCIUM: 9 mg/dL (ref 8.9–10.3)
CO2: 27 mmol/L (ref 22–32)
Chloride: 101 mmol/L (ref 98–111)
Creatinine, Ser: 0.54 mg/dL (ref 0.44–1.00)
GFR calc Af Amer: >60 mL/min (ref >60)
GFR calc non Af Amer: >60 mL/min (ref >60)
Glucose, Bld: 110 mg/dL — ABNORMAL HIGH (ref 70–99)
Potassium: 4 mmol/L (ref 3.5–5.1)
Sodium: 138 mmol/L (ref 135–145)
TOTAL PROTEIN: 6.8 g/dL (ref 6.5–8.1)

## 2018-02-18 LAB — AMMONIA: Ammonia: 112 umol/L — ABNORMAL HIGH (ref 9–35)

## 2018-02-18 MED ORDER — JUVEN PO PACK
1.0000 | PACK | Freq: Two times a day (BID) | ORAL | Status: DC
  Administered 2018-02-18 – 2018-02-25 (×15): 1 via ORAL
  Filled 2018-02-18 (×15): qty 1

## 2018-02-18 MED ORDER — LACTULOSE 10 GM/15ML PO SOLN
30.0000 g | Freq: Two times a day (BID) | ORAL | Status: DC
  Administered 2018-02-18 – 2018-03-05 (×31): 30 g via ORAL
  Filled 2018-02-18 (×33): qty 45

## 2018-02-18 MED ORDER — ENSURE ENLIVE PO LIQD
237.0000 mL | Freq: Three times a day (TID) | ORAL | Status: DC
  Administered 2018-02-18 – 2018-02-25 (×21): 237 mL via ORAL

## 2018-02-18 MED ORDER — METHOCARBAMOL 500 MG PO TABS
500.0000 mg | ORAL_TABLET | Freq: Four times a day (QID) | ORAL | Status: DC
  Administered 2018-02-18 – 2018-02-20 (×12): 500 mg via ORAL
  Filled 2018-02-18 (×12): qty 1
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
## 2018-02-18 NOTE — Social Work (Signed)
CSW acknowledging consult for access to medications at discharge.  For medication access please consult RN Case Management.   CSW signing off. Please consult if any additional needs arise.  Nattaly Yebra, MSW, LCSWA [facility] Social Work [phone]     
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
## 2018-02-19 LAB — AMMONIA: Ammonia: 82 umol/L — ABNORMAL HIGH (ref 9–35)
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
## 2018-02-20 LAB — AMMONIA: Ammonia: 80 umol/L — ABNORMAL HIGH (ref 9–35)

## 2018-02-20 MED ORDER — LACTULOSE ENEMA
300.0000 mL | Freq: Once | ORAL | Status: AC
  Administered 2018-02-20: 300 mL via RECTAL
  Filled 2018-02-20: qty 300

## 2018-02-20 MED ORDER — PANTOPRAZOLE SODIUM 40 MG PO TBEC
40.0000 mg | DELAYED_RELEASE_TABLET | Freq: Every day | ORAL | Status: DC
  Administered 2018-02-20 – 2018-03-05 (×14): 40 mg via ORAL
  Filled 2018-02-20 (×13): qty 1

## 2018-02-20 MED ORDER — IBUPROFEN 200 MG PO TABS: 400.0000 mg | ORAL_TABLET | Freq: Four times a day (QID) | ORAL | Status: DC | PRN

## 2018-02-20 MED ORDER — FUROSEMIDE 20 MG PO TABS
20.0000 mg | ORAL_TABLET | Freq: Every day | ORAL | Status: DC
  Administered 2018-02-20 – 2018-03-05 (×14): 20 mg via ORAL
  Filled 2018-02-20 (×14): qty 1
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
## 2018-02-20 NOTE — Progress Notes (Signed)
CSW met with patient and provided with substance use inpatient and outpatient resources. CSW reviewed resources with the patient. CSW provided resources on homelessness resources. CSW discussed with patient her concerns and barriers with attaining resources. CSW discussed motivational supports for recovery. CSW will continue to follow.  Lamonte Richer, LCSW, [place] Worker II [phone]

## 2018-02-21 LAB — BASIC METABOLIC PANEL
Anion gap: 11 (ref 5–15)
BUN: 6 mg/dL (ref 6–20)
CO2: 27 mmol/L (ref 22–32)
Calcium: 9.1 mg/dL (ref 8.9–10.3)
Chloride: 100 mmol/L (ref 98–111)
Creatinine, Ser: 0.49 mg/dL (ref 0.44–1.00)
GFR calc Af Amer: >60 mL/min (ref >60)
GFR calc non Af Amer: >60 mL/min (ref >60)
Glucose, Bld: 122 mg/dL — ABNORMAL HIGH (ref 70–99)
Potassium: 3.6 mmol/L (ref 3.5–5.1)
SODIUM: 138 mmol/L (ref 135–145)

## 2018-02-21 LAB — CBC
HEMATOCRIT: 38.2 % (ref 36.0–46.0)
Hemoglobin: 12.8 g/dL (ref 12.0–15.0)
MCH: 30.7 pg (ref 26.0–34.0)
MCHC: 33.5 g/dL (ref 30.0–36.0)
MCV: 91.6 fL (ref 80.0–100.0)
NRBC: 0 % (ref 0.0–0.2)
Platelets: 48 10*3/uL — ABNORMAL LOW (ref 150–400)
RBC: 4.17 MIL/uL (ref 3.87–5.11)
RDW: 13.8 % (ref 11.5–15.5)
WBC: 3.5 10*3/uL — AB (ref 4.0–10.5)

## 2018-02-21 LAB — AMMONIA: Ammonia: 64 umol/L — ABNORMAL HIGH (ref 9–35)

## 2018-02-21 MED ORDER — BACLOFEN 10 MG PO TABS
10.0000 mg | ORAL_TABLET | Freq: Three times a day (TID) | ORAL | Status: DC | PRN
  Administered 2018-02-22 – 2018-03-04 (×12): 10 mg via ORAL
  Filled 2018-02-21 (×13): qty 1

## 2018-02-21 MED ORDER — IBUPROFEN 200 MG PO TABS
600.0000 mg | ORAL_TABLET | Freq: Once | ORAL | Status: AC
  Administered 2018-02-21: 600 mg via ORAL
  Filled 2018-02-21: qty 3

## 2018-02-21 MED ORDER — IBUPROFEN 200 MG PO TABS
400.0000 mg | ORAL_TABLET | Freq: Three times a day (TID) | ORAL | Status: DC | PRN
  Administered 2018-02-21 – 2018-03-04 (×12): 400 mg via ORAL
  Filled 2018-02-21 (×14): qty 2
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
## 2018-02-22 LAB — BASIC METABOLIC PANEL
Anion gap: 9 (ref 5–15)
BUN: 8 mg/dL (ref 6–20)
CHLORIDE: 103 mmol/L (ref 98–111)
CO2: 29 mmol/L (ref 22–32)
Calcium: 9 mg/dL (ref 8.9–10.3)
Creatinine, Ser: 0.55 mg/dL (ref 0.44–1.00)
GFR calc Af Amer: >60 mL/min (ref >60)
GFR calc non Af Amer: >60 mL/min (ref >60)
Glucose, Bld: 97 mg/dL (ref 70–99)
Potassium: 4 mmol/L (ref 3.5–5.1)
Sodium: 141 mmol/L (ref 135–145)

## 2018-02-22 LAB — CBC
HEMATOCRIT: 38.6 % (ref 36.0–46.0)
Hemoglobin: 12.4 g/dL (ref 12.0–15.0)
MCH: 29.8 pg (ref 26.0–34.0)
MCHC: 32.1 g/dL (ref 30.0–36.0)
MCV: 92.8 fL (ref 80.0–100.0)
Platelets: 44 10*3/uL — ABNORMAL LOW (ref 150–400)
RBC: 4.16 MIL/uL (ref 3.87–5.11)
RDW: 14.1 % (ref 11.5–15.5)
WBC: 3.4 10*3/uL — ABNORMAL LOW (ref 4.0–10.5)
nRBC: 0 % (ref 0.0–0.2)

## 2018-02-22 LAB — AMMONIA: Ammonia: 56 umol/L — ABNORMAL HIGH (ref 9–35)

## 2018-02-22 MED ORDER — HYDROMORPHONE HCL 1 MG/ML IJ SOLN: 1.0000 mg | INTRAMUSCULAR | Status: DC | PRN

## 2018-02-22 MED ORDER — BACITRACIN ZINC 500 UNIT/GM EX OINT
TOPICAL_OINTMENT | Freq: Two times a day (BID) | CUTANEOUS | Status: DC
  Administered 2018-02-22 (×2): 31.5556 via TOPICAL
  Administered 2018-02-23: 09:00:00 via TOPICAL
  Administered 2018-02-23: 31.5556 via TOPICAL
  Administered 2018-02-24 (×2): via TOPICAL
  Administered 2018-02-25: 31.5556 via TOPICAL
  Administered 2018-02-25: 09:00:00 via TOPICAL
  Administered 2018-02-26 (×2): 31.5556 via TOPICAL
  Administered 2018-02-27 (×2): 1 via TOPICAL
  Administered 2018-02-28: 10:00:00 via TOPICAL
  Administered 2018-02-28: 1 via TOPICAL
  Administered 2018-03-01 (×2): 31.5556 via TOPICAL
  Administered 2018-03-02 (×2): via TOPICAL
  Administered 2018-03-03: 1 via TOPICAL
  Administered 2018-03-03 – 2018-03-05 (×4): via TOPICAL
  Filled 2018-02-22 (×2): qty 28.4

## 2018-02-22 MED ORDER — OXYCODONE HCL 5 MG PO TABS
10.0000 mg | ORAL_TABLET | ORAL | Status: DC | PRN
  Administered 2018-02-22 – 2018-03-05 (×47): 15 mg via ORAL
  Filled 2018-02-22 (×17): qty 3
  Filled 2018-02-22: qty 2
  Filled 2018-02-22 (×32): qty 3
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
## 2018-02-23 LAB — CBC
HEMATOCRIT: 37.2 % (ref 36.0–46.0)
Hemoglobin: 12 g/dL (ref 12.0–15.0)
MCH: 29.9 pg (ref 26.0–34.0)
MCHC: 32.3 g/dL (ref 30.0–36.0)
MCV: 92.5 fL (ref 80.0–100.0)
Platelets: 54 10*3/uL — ABNORMAL LOW (ref 150–400)
RBC: 4.02 MIL/uL (ref 3.87–5.11)
RDW: 14 % (ref 11.5–15.5)
WBC: 3.5 10*3/uL — ABNORMAL LOW (ref 4.0–10.5)
nRBC: 0 % (ref 0.0–0.2)

## 2018-02-23 LAB — BASIC METABOLIC PANEL
Anion gap: 9 (ref 5–15)
BUN: 7 mg/dL (ref 6–20)
CHLORIDE: 104 mmol/L (ref 98–111)
CO2: 28 mmol/L (ref 22–32)
Calcium: 9.4 mg/dL (ref 8.9–10.3)
Creatinine, Ser: 0.55 mg/dL (ref 0.44–1.00)
GFR calc non Af Amer: >60 mL/min (ref >60)
Glucose, Bld: 112 mg/dL — ABNORMAL HIGH (ref 70–99)
Potassium: 4 mmol/L (ref 3.5–5.1)
Sodium: 141 mmol/L (ref 135–145)

## 2018-02-23 LAB — AMMONIA: Ammonia: 122 umol/L — ABNORMAL HIGH (ref 9–35)
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
## 2018-02-24 LAB — CBC
HCT: 37 % (ref 36.0–46.0)
Hemoglobin: 12.1 g/dL (ref 12.0–15.0)
MCH: 30.4 pg (ref 26.0–34.0)
MCHC: 32.7 g/dL (ref 30.0–36.0)
MCV: 93 fL (ref 80.0–100.0)
Platelets: 53 10*3/uL — ABNORMAL LOW (ref 150–400)
RBC: 3.98 MIL/uL (ref 3.87–5.11)
RDW: 13.8 % (ref 11.5–15.5)
WBC: 3.6 10*3/uL — ABNORMAL LOW (ref 4.0–10.5)
nRBC: 0 % (ref 0.0–0.2)

## 2018-02-24 LAB — BASIC METABOLIC PANEL
Anion gap: 10 (ref 5–15)
BUN: 7 mg/dL (ref 6–20)
CO2: 29 mmol/L (ref 22–32)
Calcium: 9.3 mg/dL (ref 8.9–10.3)
Chloride: 102 mmol/L (ref 98–111)
Creatinine, Ser: 0.66 mg/dL (ref 0.44–1.00)
GFR calc Af Amer: >60 mL/min (ref >60)
GFR calc non Af Amer: >60 mL/min (ref >60)
Glucose, Bld: 120 mg/dL — ABNORMAL HIGH (ref 70–99)
POTASSIUM: 3.9 mmol/L (ref 3.5–5.1)
Sodium: 141 mmol/L (ref 135–145)

## 2018-02-24 LAB — AMMONIA: AMMONIA: 62 umol/L — AB (ref 9–35)
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
## 2018-02-25 LAB — BASIC METABOLIC PANEL
Anion gap: 10 (ref 5–15)
BUN: 7 mg/dL (ref 6–20)
CO2: 25 mmol/L (ref 22–32)
Calcium: 9.1 mg/dL (ref 8.9–10.3)
Chloride: 106 mmol/L (ref 98–111)
Creatinine, Ser: 0.58 mg/dL (ref 0.44–1.00)
GFR calc Af Amer: >60 mL/min (ref >60)
GFR calc non Af Amer: >60 mL/min (ref >60)
Glucose, Bld: 108 mg/dL — ABNORMAL HIGH (ref 70–99)
POTASSIUM: 3.9 mmol/L (ref 3.5–5.1)
Sodium: 141 mmol/L (ref 135–145)

## 2018-02-25 LAB — CBC
HCT: 37.1 % (ref 36.0–46.0)
HEMOGLOBIN: 12.2 g/dL (ref 12.0–15.0)
MCH: 30.2 pg (ref 26.0–34.0)
MCHC: 32.9 g/dL (ref 30.0–36.0)
MCV: 91.8 fL (ref 80.0–100.0)
Platelets: 69 10*3/uL — ABNORMAL LOW (ref 150–400)
RBC: 4.04 MIL/uL (ref 3.87–5.11)
RDW: 13.6 % (ref 11.5–15.5)
WBC: 4.2 10*3/uL (ref 4.0–10.5)
nRBC: 0 % (ref 0.0–0.2)

## 2018-02-25 LAB — AMMONIA: Ammonia: 116 umol/L — ABNORMAL HIGH (ref 9–35)

## 2018-02-25 MED ORDER — ENSURE ENLIVE PO LIQD
237.0000 mL | Freq: Two times a day (BID) | ORAL | Status: DC
  Administered 2018-02-26 – 2018-03-05 (×15): 237 mL via ORAL
  Filled 2018-02-25: qty 237
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
## 2018-02-27 NOTE — Progress Notes (Signed)
Trauma Service Note  Subjective: Patient complaining of throbbing pain in her left facial area.  No acute distress.  Nauseated  Objective: Vital signs in last 24 hours: Temp:  [98.2 F (36.8 C)-98.7 F (37.1 C)] 98.4 F (36.9 C) (12/21 0811) Pulse Rate:  [54-67] 67 (12/21 0811) BP: (100-118)/(58-71) 110/71 (12/21 0811) SpO2:  [97 %-100 %] 100 % (12/21 0811) Last BM Date: 02/26/18  Intake/Output from previous day: 12/20 0701 - 12/21 0700 In: 720 [P.O.:720] Out: -  Intake/Output this shift: Total I/O In: 240 [P.O.:240] Out: -   General: No distress.    Lungs: Clear  Abd: Benign  Extremities: No changes  Neuro: Intact  Lab Results: CBC  Recent Labs    02/25/18 0456  WBC 4.2  HGB 12.2  HCT 37.1  PLT 69*   BMET Recent Labs    02/25/18 0456  NA 141  K 3.9  CL 106  CO2 25  GLUCOSE 108*  BUN 7  CREATININE 0.58  CALCIUM 9.1   PT/INR No results for input(s): LABPROT, INR in the last 72 hours. ABG No results for input(s): PHART, HCO3 in the last 72 hours.  Invalid input(s): PCO2, PO2  Studies/Results: No results found.  Anti-infectives: Anti-infectives (From admission, onward)   Start     Dose/Rate Route Frequency Ordered Stop   02/15/18 0545  ceFAZolin (ANCEF) IVPB 1 g/50 mL premix     1 g 100 mL/hr over 30 Minutes Intravenous  Once 02/15/18 0533 02/15/18 1028      Assessment/Plan: s/p  No changes  LOS: 12 days   Marta LamasJames O. Gae BonWyatt, III, MD, FACS [phone] Trauma Surgeon 02/27/2018

## 2018-02-28 NOTE — Progress Notes (Signed)
CSW met with patient to follow up on discharge planning. CSW noted plans established over the past week patient would live with her mother due to homelessness and lack of financial resources to cover SNF care or home health. CSW attempted to call patient's mother and noted the phone number was no longer in service.  CSW clarified with patient the listed number was "out of date" and patient did not know how to contact her mother. CSW noted patient affirmed she would be released today but had no way to confirm. CSW noted per patient her mother would not have a home as she is just getting released. CSW noted patient's statement "she should be able to get a place quickly." Patient inquired into other family/friends patient could stay with and noted patient presented no alternative plan.  CSW answered patient's questions about applying for Medicaid and noted she was denied Medicaid coverage 4 months ago. CSW will continue to follow.  Lamonte Richer, LCSW, [place] Worker II [phone]

## 2018-02-28 NOTE — Progress Notes (Signed)
Trauma Service Note  Subjective: Awake and alert.  Still having pain in her face  Objective: Vital signs in last 24 hours: Temp:  [97.9 F (36.6 C)-98.7 F (37.1 C)] 98.2 F (36.8 C) (12/22 0808) Pulse Rate:  [57-67] 67 (12/22 0808) Resp:  [16-18] 16 (12/22 0808) BP: (100-116)/(58-77) 105/71 (12/22 0808) SpO2:  [96 %-100 %] 98 % (12/22 0808) Last BM Date: 02/27/18  Intake/Output from previous day: 12/21 0701 - 12/22 0700 In: 240 [P.O.:240] Out: -  Intake/Output this shift: No intake/output data recorded.  General: No acute distress  Lungs: Clear  Abd: Benign with good bowel sounds.  Extremities: No clinical signs or symptoms of DVT  Neuro: Intact  Lab Results: CBC  No results for input(s): WBC, HGB, HCT, PLT in the last 72 hours. BMET No results for input(s): NA, K, CL, CO2, GLUCOSE, BUN, CREATININE, CALCIUM in the last 72 hours. PT/INR No results for input(s): LABPROT, INR in the last 72 hours. ABG No results for input(s): PHART, HCO3 in the last 72 hours.  Invalid input(s): PCO2, PO2  Studies/Results: No results found.  Anti-infectives: Anti-infectives (From admission, onward)   Start     Dose/Rate Route Frequency Ordered Stop   02/15/18 0545  ceFAZolin (ANCEF) IVPB 1 g/50 mL premix     1 g 100 mL/hr over 30 Minutes Intravenous  Once 02/15/18 0533 02/15/18 1028      Assessment/Plan: s/p  No changes in management.  LOS: 13 days   Marta LamasJames O. Gae BonWyatt, III, MD, FACS [phone] Trauma Surgeon 02/28/2018
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
## 2018-03-04 MED ORDER — IBUPROFEN 400 MG PO TABS: 400.0000 mg | ORAL_TABLET | Freq: Three times a day (TID) | ORAL | 0 refills | Status: AC | PRN

## 2018-03-04 MED ORDER — ADULT MULTIVITAMIN W/MINERALS CH: 1.0000 | ORAL_TABLET | Freq: Every day | ORAL | Status: AC

## 2018-03-04 MED ORDER — SPIRONOLACTONE 25 MG PO TABS: 25.0000 mg | ORAL_TABLET | Freq: Every day | ORAL | 0 refills | Status: AC

## 2018-03-04 MED ORDER — LACTULOSE 10 GM/15ML PO SOLN: 20.0000 g | Freq: Every day | ORAL | 0 refills | Status: AC

## 2018-03-04 MED ORDER — OXYCODONE HCL 10 MG PO TABS: 5.0000 mg | ORAL_TABLET | Freq: Four times a day (QID) | ORAL | 0 refills | Status: AC | PRN

## 2018-03-04 MED ORDER — DOCUSATE SODIUM 100 MG PO CAPS: 100.0000 mg | ORAL_CAPSULE | Freq: Two times a day (BID) | ORAL | 0 refills | Status: AC

## 2018-03-04 MED ORDER — FUROSEMIDE 20 MG PO TABS: 20.0000 mg | ORAL_TABLET | Freq: Every day | ORAL | 0 refills | Status: AC

## 2018-03-04 MED ORDER — BACLOFEN 10 MG PO TABS: 10.0000 mg | ORAL_TABLET | Freq: Three times a day (TID) | ORAL | 0 refills | Status: AC | PRN

## 2018-03-04 MED ORDER — BUSPIRONE HCL 15 MG PO TABS: 15.0000 mg | ORAL_TABLET | Freq: Three times a day (TID) | ORAL | 0 refills | Status: AC

## 2018-03-04 MED ORDER — PANTOPRAZOLE SODIUM 40 MG PO TBEC: 40.0000 mg | DELAYED_RELEASE_TABLET | Freq: Every day | ORAL | 0 refills | Status: AC

## 2018-03-04 MED FILL — LACTULOSE 10 GM/15 ML SOLN: 10 | 31 days supply | Qty: 946 | Fill #0

## 2018-03-04 MED FILL — PANTOPRAZOLE SOD DR 40 MG T: 40 | 30 days supply | Qty: 30 | Fill #0

## 2018-03-04 MED FILL — FUROSEMIDE 20 MG TAB: 20 | 30 days supply | Qty: 30 | Fill #0

## 2018-03-04 MED FILL — oxyCODONE HCL 10 MG TABS: 10 | 6 days supply | Qty: 20 | Fill #0

## 2018-03-04 MED FILL — busPIRone HCL 15 MG TABS: 15 | 30 days supply | Qty: 90 | Fill #0

## 2018-03-04 MED FILL — SPIRONOLACTONE 25 MG TABLET: 25 | 30 days supply | Qty: 30 | Fill #0

## 2018-03-04 MED FILL — BACLOFEN 10 MG TABS: 10 | 5 days supply | Qty: 15 | Fill #0
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
## 2018-03-04 NOTE — Progress Notes (Signed)
RW delivered to room for patient d/c tomorrow. Prescriptions ordered by PA to be delivered tomorrow by pharmacy through [facility].
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
## 2018-03-05 NOTE — Clinical Social Work Note (Signed)
Clinical Social Worker [place] and [place].  Patient verbalized appreciation and agreeable with current discharge plan.  Clinical Social Worker will sign off for now as social work intervention is no longer needed. Please consult us again if new need arises.  Macario GoldsJesse Shealee Yordy, [place] [phone]

## 2018-03-05 NOTE — Progress Notes (Signed)
Discharge instructions reviewed with patient.  Prescriptions delivered to patient prior to discharge.  Patient transported via wheelchair with bus pass in the care of boyfriend.
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
## 2018-03-05 NOTE — Discharge Instructions (Signed)
Pain Medicine Instructions You may need pain medicine after an injury or illness. Two common types of pain medicine are:  Opioid pain medicine. These may be called opioids.  Non-opioid pain medicine. This includes NSAIDs. It is important to follow your doctor's instructions when you are taking pain medicine. Doing this can keep yourself and others safe. How can pain medicine affect me? Pain medicine may not make all of your pain go away. It should make you comfortable enough to:  Move.  Breathe.  Do normal activities. Opioids can cause side effects, such as:  Trouble pooping (constipation).  Feeling sick to your stomach (nausea).  Throwing up (vomiting).  Feeling very sleepy.  Confusion.  Taking the medicine for nonmedical reasons even though taking it hurts your health and well-being (opioid use disorder).  Trouble breathing (respiratory depression). Taking opioids for longer than 3 days raises your risk of these side effects. Taking opioids for a long time can affect how well you can do daily tasks. Taking them for a long time also puts you at risk for:  Car crashes.  Depression.  Suicide.  Heart attack.  Taking too much of the medicine (overdose). This can lead to death. What should I do to stay safe while taking pain medicine? Take your medicine as told  Take pain medicine exactly as told by your doctor. Take it only when you need it.  Write down the times when you take your pain medicine. Look at the times before you take your next dose.  Take other over-the-counter or prescription medicines only as told by your doctor. ? If your pain medicine has acetaminophen in it, do not take any other acetaminophen while you are taking this medicine. Too much can damage the liver.  Get pain medicine prescriptions from only one doctor. Avoid certain activities While you are taking prescription pain medicine, and for 8 hours after your last dose:  Do not drive.  Do  not use machinery.  Do not use power tools.  Do not sign legal documents.  Do not drink alcohol.  Do not take sleeping pills.  Do not take care of children by yourself.  Do not do any activities that involve climbing or being in high places.  Do not go into any body of water unless there is an adult nearby who can watch you and help you if needed. This includes: ? [place]. ? Rivers. ? Oceans. ? Spas. ? Swimming pools.  Keep others safe  Store your medicine as told by your doctor. Keep it where children and pets cannot reach it.  Do not share your pain medicine with anyone.  Do not save any leftover pills. If you have leftover pills, you can: ? Bring them to a take-back program. ? Bring them to a pharmacy that has a drug disposal container. ? Throw them in the trash. Check the medicine label or package insert to see if it is safe to throw it out. If it is safe, take the medicine out of the container. Mix it with something that makes it unusable, such as pet waste. Then put the medicine in the trash. General instructions  Talk with your doctor about other ways to manage your pain.  If you have trouble pooping: ? Drink enough fluid to keep your pee (urine) pale yellow. ? Use a poop (stool) softener as told by your doctor. ? Eat more fruits and vegetables.  Keep all follow-up visits as told by your doctor. This is important. Contact a  doctor about other ways to manage your pain.  · If you have trouble pooping:  ? Drink enough fluid to keep your pee (urine) pale yellow.  ? Use a poop (stool) softener as told by your doctor.  ? Eat more fruits and vegetables.  · Keep all follow-up visits as told by your doctor. This is important.  Contact a doctor if:  · Your medicine is not helping with your pain.  · You have a rash.  · You feel depressed.  Get help right away if:  Seek medical care right away if you are taking pain medicines and you (or people close to you) notice any of the following:  · Trouble breathing.  · Breathing that is shorter than normal.  · Breathing that is more shallow than normal.  · Confusion.  · Sleepiness.  · Trouble staying awake.  · Feeling sick to your stomach.  · Throwing up.  · Your skin or lips turning pale or bluish in color.  · Tongue swelling.  If you ever  feel like you may hurt yourself or others, or have thoughts about taking your own life, get help right away. Go to your nearest emergency department or call:  · Your local emergency services (911 in the [place]  · A suicide crisis helpline, such as the National Suicide Prevention Lifeline at 1-800-273-8255. This is open 24 hours a day.  Summary  · Take your pain medicine exactly as told by your doctor.  · Pain medicine can help lower your pain. It may also cause side effects.  · Talk with your doctor about other ways to manage your pain.  · Follow your doctor's instructions about how to take your pain medicine and keep others safe. Ask what activities you should avoid while taking pain medicine.  This information is not intended to replace advice given to you by your health care provider. Make sure you discuss any questions you have with your health care provider.  Document Released: 08/13/2007 Document Revised: 10/06/2016 Document Reviewed: 10/06/2016  Elsevier Interactive Patient Education © 2019 [employer]

## 2018-07-24 ENCOUNTER — Other Ambulatory Visit: Payer: Self-pay

## 2018-07-24 ENCOUNTER — Encounter (HOSPITAL_COMMUNITY): Payer: Self-pay | Admitting: *Deleted

## 2018-07-24 ENCOUNTER — Emergency Department (HOSPITAL_COMMUNITY)
Admission: EM | Admit: 2018-07-24 | Discharge: 2018-07-24 | Disposition: A | Discharge status: Home / Self Care | Payer: Medicaid Other | Attending: Emergency Medicine | Admitting: Emergency Medicine

## 2018-07-24 DIAGNOSIS — E7220 Disorder of urea cycle metabolism, unspecified: Secondary | ICD-10-CM | POA: Insufficient documentation

## 2018-07-24 DIAGNOSIS — K709 Alcoholic liver disease, unspecified: Secondary | ICD-10-CM | POA: Diagnosis not present

## 2018-07-24 DIAGNOSIS — E119 Type 2 diabetes mellitus without complications: Secondary | ICD-10-CM | POA: Diagnosis not present

## 2018-07-24 DIAGNOSIS — R109 Unspecified abdominal pain: Secondary | ICD-10-CM | POA: Diagnosis present

## 2018-07-24 DIAGNOSIS — I509 Heart failure, unspecified: Secondary | ICD-10-CM | POA: Insufficient documentation

## 2018-07-24 DIAGNOSIS — Y908 Blood alcohol level of 240 mg/100 ml or more: Secondary | ICD-10-CM | POA: Insufficient documentation

## 2018-07-24 DIAGNOSIS — Z79899 Other long term (current) drug therapy: Secondary | ICD-10-CM | POA: Diagnosis not present

## 2018-07-24 DIAGNOSIS — F1010 Alcohol abuse, uncomplicated: Secondary | ICD-10-CM | POA: Diagnosis not present

## 2018-07-24 DIAGNOSIS — I110 Hypertensive heart disease with heart failure: Secondary | ICD-10-CM | POA: Diagnosis not present

## 2018-07-24 LAB — CBC WITH DIFFERENTIAL/PLATELET
Abs Immature Granulocytes: 0.01 10*3/uL (ref 0.00–0.07)
Basophils Absolute: 0.1 10*3/uL (ref 0.0–0.1)
Basophils Relative: 1 %
Eosinophils Absolute: 0 10*3/uL (ref 0.0–0.5)
Eosinophils Relative: 1 %
HCT: 37.9 % (ref 36.0–46.0)
Hemoglobin: 12.8 g/dL (ref 12.0–15.0)
Immature Granulocytes: 0 %
Lymphocytes Relative: 47 %
Lymphs Abs: 1.9 10*3/uL (ref 0.7–4.0)
MCH: 31.8 pg (ref 26.0–34.0)
MCHC: 33.8 g/dL (ref 30.0–36.0)
MCV: 94 fL (ref 80.0–100.0)
Monocytes Absolute: 0.3 10*3/uL (ref 0.1–1.0)
Monocytes Relative: 8 %
Neutro Abs: 1.8 10*3/uL (ref 1.7–7.7)
Neutrophils Relative %: 43 %
Platelets: 54 10*3/uL — ABNORMAL LOW (ref 150–400)
RBC: 4.03 MIL/uL (ref 3.87–5.11)
RDW: 13.4 % (ref 11.5–15.5)
WBC: 4.1 10*3/uL (ref 4.0–10.5)
nRBC: 0 % (ref 0.0–0.2)

## 2018-07-24 LAB — COMPREHENSIVE METABOLIC PANEL
ALT: 88 U/L — ABNORMAL HIGH (ref 0–44)
AST: 179 U/L — ABNORMAL HIGH (ref 15–41)
Albumin: 3.7 g/dL (ref 3.5–5.0)
Alkaline Phosphatase: 77 U/L (ref 38–126)
Anion gap: 9 (ref 5–15)
BUN: 5 mg/dL — ABNORMAL LOW (ref 6–20)
CO2: 24 mmol/L (ref 22–32)
Calcium: 8.5 mg/dL — ABNORMAL LOW (ref 8.9–10.3)
Chloride: 108 mmol/L (ref 98–111)
Creatinine, Ser: 0.5 mg/dL (ref 0.44–1.00)
GFR calc Af Amer: >60 mL/min (ref >60)
GFR calc non Af Amer: >60 mL/min (ref >60)
Glucose, Bld: 125 mg/dL — ABNORMAL HIGH (ref 70–99)
Potassium: 3.5 mmol/L (ref 3.5–5.1)
Sodium: 141 mmol/L (ref 135–145)
Total Bilirubin: 1.5 mg/dL — ABNORMAL HIGH (ref 0.3–1.2)
Total Protein: 6.8 g/dL (ref 6.5–8.1)

## 2018-07-24 LAB — URINALYSIS, ROUTINE W REFLEX MICROSCOPIC
Bilirubin Urine: NEGATIVE
Glucose, UA: NEGATIVE mg/dL
Hgb urine dipstick: NEGATIVE
Ketones, ur: NEGATIVE mg/dL
Nitrite: NEGATIVE
Protein, ur: 30 mg/dL — AB
Specific Gravity, Urine: 1.018 (ref 1.005–1.030)
Squamous Epithelial / HPF: >50 — ABNORMAL HIGH (ref 0–5)
pH: 6 (ref 5.0–8.0)

## 2018-07-24 LAB — AMMONIA: Ammonia: 58 umol/L — ABNORMAL HIGH (ref 9–35)

## 2018-07-24 LAB — I-STAT BETA HCG BLOOD, ED (MC, WL, AP ONLY): I-stat hCG, quantitative: <5 m[IU]/mL (ref <5)

## 2018-07-24 LAB — PROTIME-INR
INR: 1.3 — ABNORMAL HIGH (ref 0.8–1.2)
Prothrombin Time: 16.1 seconds — ABNORMAL HIGH (ref 11.4–15.2)

## 2018-07-24 LAB — ETHANOL: Alcohol, Ethyl (B): 325 mg/dL (ref <10)

## 2018-07-24 LAB — LIPASE, BLOOD: Lipase: 39 U/L (ref 11–51)

## 2018-07-24 MED ORDER — ONDANSETRON HCL 4 MG/2ML IJ SOLN
4.00 | INTRAMUSCULAR | Status: DC
Start: ? — End: 2018-07-24

## 2018-07-24 MED ORDER — DROPERIDOL 2.5 MG/ML IJ SOLN
1.2500 mg | Freq: Once | INTRAMUSCULAR | Status: AC
  Administered 2018-07-24: 13:00:00 1.25 mg via INTRAVENOUS
  Filled 2018-07-24: qty 2

## 2018-07-24 MED ORDER — FENTANYL CITRATE (PF) 100 MCG/2ML IJ SOLN
50.0000 ug | Freq: Once | INTRAMUSCULAR | Status: AC
  Administered 2018-07-24: 50 ug via INTRAVENOUS
  Filled 2018-07-24: qty 2
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
## 2018-07-24 NOTE — ED Triage Notes (Signed)
PT at out of room demanding to have IV removed . Pt pulled IV out and walked out leaving AMA.

## 2018-07-24 NOTE — ED Triage Notes (Signed)
Pt walked out thur lobby to go home. Pt A/O and walking with out difficulty.

## 2018-07-24 NOTE — ED Notes (Signed)
Pt took out IV herself and walked out double doors to lobby. Pt was told numerous times to stay and to not leave. Pt stated "I dont care" and left.

## 2018-07-24 NOTE — ED Triage Notes (Signed)
PT reports having N/V for 2 days. Pt reports she has not been able to keep fluids down . Pt also reports cirrhosis and needs fluid drawn off. Pt also reports hx of pancreatitis.  PT reports she last drank Alcohol this morning.

## 2018-07-24 NOTE — ED Triage Notes (Signed)
PT out of bed and stated" if you are noyt going to do anything for me I need to go."  Pt instructed she that waiting for results of test . Pt belligerent and demanding a PART BUS PASS because she came a long way . Pt reported she lives in [place]  And needs a way home.

## 2018-07-24 NOTE — ED Notes (Signed)
PT Left AMA. 

## 2018-12-09 DEATH — deceased
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
